# Patient Record
Sex: Female | Born: 2000 | Race: Black or African American | Hispanic: No | Marital: Single | Smoking: Never smoker
Health system: Southern US, Community
[De-identification: ages and names within clinical notes are randomized; demographics above are authoritative.]

## PROBLEM LIST (undated history)

## (undated) DIAGNOSIS — F410 Panic disorder [episodic paroxysmal anxiety] without agoraphobia: Secondary | ICD-10-CM

## (undated) DIAGNOSIS — E119 Type 2 diabetes mellitus without complications: Secondary | ICD-10-CM

---
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
## 2019-05-28 ENCOUNTER — Other Ambulatory Visit: Payer: Self-pay

## 2019-05-28 ENCOUNTER — Ambulatory Visit: Payer: BLUE CROSS/BLUE SHIELD | Attending: Internal Medicine

## 2019-05-28 DIAGNOSIS — Z20822 Contact with and (suspected) exposure to covid-19: Secondary | ICD-10-CM

## 2019-05-29 LAB — SARS-COV-2, NAA 2 DAY TAT

## 2019-05-29 LAB — NOVEL CORONAVIRUS, NAA: SARS-CoV-2, NAA: DETECTED — AB

## 2020-09-12 ENCOUNTER — Emergency Department (HOSPITAL_BASED_OUTPATIENT_CLINIC_OR_DEPARTMENT_OTHER): Payer: BLUE CROSS/BLUE SHIELD

## 2020-09-12 ENCOUNTER — Encounter (HOSPITAL_BASED_OUTPATIENT_CLINIC_OR_DEPARTMENT_OTHER): Payer: Self-pay

## 2020-09-12 ENCOUNTER — Emergency Department (HOSPITAL_BASED_OUTPATIENT_CLINIC_OR_DEPARTMENT_OTHER)
Admission: EM | Admit: 2020-09-12 | Discharge: 2020-09-12 | Disposition: A | Discharge status: Home / Self Care | Payer: BLUE CROSS/BLUE SHIELD | Attending: Emergency Medicine | Admitting: Emergency Medicine

## 2020-09-12 ENCOUNTER — Other Ambulatory Visit: Payer: Self-pay

## 2020-09-12 DIAGNOSIS — S0990XA Unspecified injury of head, initial encounter: Secondary | ICD-10-CM | POA: Diagnosis not present

## 2020-09-12 DIAGNOSIS — F17200 Nicotine dependence, unspecified, uncomplicated: Secondary | ICD-10-CM | POA: Insufficient documentation

## 2020-09-12 DIAGNOSIS — W19XXXA Unspecified fall, initial encounter: Secondary | ICD-10-CM

## 2020-09-12 DIAGNOSIS — E119 Type 2 diabetes mellitus without complications: Secondary | ICD-10-CM | POA: Diagnosis not present

## 2020-09-12 DIAGNOSIS — W182XXA Fall in (into) shower or empty bathtub, initial encounter: Secondary | ICD-10-CM | POA: Diagnosis not present

## 2020-09-12 MED ORDER — ACETAMINOPHEN 325 MG PO TABS
650.0000 mg | ORAL_TABLET | Freq: Once | ORAL | Status: AC
  Administered 2020-09-12: 650 mg via ORAL
  Filled 2020-09-12: qty 2

## 2020-09-12 MED ORDER — ONDANSETRON 4 MG PO TBDP
4.0000 mg | ORAL_TABLET | Freq: Once | ORAL | Status: AC
  Administered 2020-09-12: 4 mg via ORAL
  Filled 2020-09-12: qty 1

## 2020-09-12 NOTE — ED Provider Notes (Signed)
[facility] EMERGENCY DEPARTMENT Provider Note   CSN: [id number] Arrival date & time: 09/12/20  1816     History Chief Complaint  Patient presents with   Head Injury    Mindy Powers is a 20 y.o. female.  20 year old female with a past medical history of diabetes, panic attack presents to the ED status post mechanical fall while taking a shower around 3 AM this morning.  Patient reports she felt like she slipped, causing her to go face forward onto the wall of the bathtub.  There is pain to the frontal aspect, with a large goose egg noted.  States that she has had since the incident dizziness, nausea, feels that her eyes are somewhat heavy.  She has not taken any medication for improvement in her symptoms.  She does not have any prior history of aneurysms.  He does endorse some photophobia, phonophobia.  No vomiting, no loss of vision, no neck pain.  The history is provided by the patient and medical records.  Head Injury Location:  Frontal Time since incident:  16 hours Mechanism of injury: fall   Fall:    Fall occurred:  Standing   Impact surface:  Primary school teacher of impact:  Head   Entrapped after fall: no   Pain details:    Quality:  Aching   Radiates to:  Face   Severity:  Moderate   Duration:  16 hours   Timing:  Constant   Progression:  Unchanged Chronicity:  New Ineffective treatments:  None tried Associated symptoms: headache and nausea   Associated symptoms: no double vision, no loss of consciousness, no neck pain, no numbness and no vomiting       Past Medical History:  Diagnosis Date   Diabetes mellitus without complication (HCC)    prediabetes   Panic attack     There are no problems to display for this patient.   History reviewed. No pertinent surgical history.   OB History     Gravida  0   Para  0   Term  0   Preterm  0   AB  0   Living  0      SAB  0   IAB  0   Ectopic  0   Multiple  0   Live Births  0            No family history on file.  Social History   Tobacco Use   Smoking status: Never   Smokeless tobacco: Never   Tobacco comments:    pt reports she vapes  Vaping Use   Vaping Use: Every day  Substance Use Topics   Alcohol use: Yes   Drug use: Never    Home Medications Prior to Admission medications   Not on File    Allergies    Patient has no known allergies.  Review of Systems   Review of Systems  Constitutional:  Negative for chills and fever.  HENT:  Negative for sore throat.   Eyes:  Negative for double vision.  Respiratory:  Negative for shortness of breath.   Cardiovascular:  Negative for chest pain.  Gastrointestinal:  Positive for nausea. Negative for abdominal pain, constipation and vomiting.  Musculoskeletal:  Negative for neck pain.  Neurological:  Positive for headaches. Negative for loss of consciousness and numbness.  All other systems reviewed and are negative.  Physical Exam Updated Vital Signs BP (!) 136/94 (BP Location: Left Arm)   Pulse Marland Kitchen)  110   Temp 98.4 F (36.9 C) (Oral)   Resp 16   Ht 5\' 8"  (1.727 m)   Wt 93.4 kg   LMP 09/07/2020   SpO2 98%   BMI 31.32 kg/m   Physical Exam Vitals and nursing note reviewed.  Constitutional:      Appearance: Normal appearance.  HENT:     Head: Normocephalic.     Comments: Large goose egg to the forehead Eyes:     Pupils: Pupils are equal, round, and reactive to light.     Comments: Pupils are equal and reactive.   Neck:     Comments: No midline tenderness. Full ROM.  Cardiovascular:     Rate and Rhythm: Normal rate.  Pulmonary:     Effort: Pulmonary effort is normal.  Abdominal:     General: Abdomen is flat.  Musculoskeletal:     Cervical back: Normal range of motion and neck supple.  Skin:    General: Skin is warm and dry.  Neurological:     Mental Status: She is alert and oriented to person, place, and time.     Comments: Alert, oriented, thought content appropriate. Speech fluent  without evidence of aphasia. Able to follow 2 step commands without difficulty.  Cranial Nerves:  II:  Peripheral visual fields grossly normal, pupils, round, reactive to light III,IV, VI: ptosis not present, extra-ocular motions intact bilaterally  V,VII: smile symmetric, facial light touch sensation equal VIII: hearing grossly normal bilaterally  IX,X: midline uvula rise  XI: bilateral shoulder shrug equal and strong XII: midline tongue extension  Motor:  5/5 in upper and lower extremities bilaterally including strong and equal grip strength and dorsiflexion/plantar flexion Sensory: light touch normal in all extremities.  Cerebellar: normal finger-to-nose with bilateral upper extremities, pronator drift negative Gait: normal gait and balance      ED Results / Procedures / Treatments   Labs (all labs ordered are listed, but only abnormal results are displayed) Labs Reviewed - No data to display  EKG None  Radiology CT HEAD WO CONTRAST (11/07/2020)  Result Date: 09/12/2020 CLINICAL DATA:  Fall yesterday with facial pain, initial encounter EXAM: CT HEAD WITHOUT CONTRAST TECHNIQUE: Contiguous axial images were obtained from the base of the skull through the vertex without intravenous contrast. COMPARISON:  None. FINDINGS: Brain: No evidence of acute infarction, hemorrhage, hydrocephalus, extra-axial collection or mass lesion/mass effect. Vascular: No hyperdense vessel or unexpected calcification. Skull: Normal. Negative for fracture or focal lesion. Sinuses/Orbits: No acute finding. Other: None. IMPRESSION: No acute intracranial abnormality noted. Electronically Signed   By: 09/14/2020 M.D.   On: 09/12/2020 19:05    Procedures Procedures   Medications Ordered in ED Medications  acetaminophen (TYLENOL) tablet 650 mg (650 mg Oral Given 09/12/20 1848)  ondansetron (ZOFRAN-ODT) disintegrating tablet 4 mg (4 mg Oral Given 09/12/20 1848)    ED Course  I have reviewed the triage vital signs  and the nursing notes.  Pertinent labs & imaging results that were available during my care of the patient were reviewed by me and considered in my medical decision making (see chart for details).    MDM Rules/Calculators/A&P   Patient presents to the ED status post mechanical fall approximately 16 hours prior to arrival.  Endorses a headache that began after the fall, this is mostly around the front aspect of her head with radiation to her eyes, which she feels that these are heavy.  Describes dizziness with standing along with ambulating.  She is also  had some nausea but has not had any vomiting episodes.  She has not taken any medication for improvement in her headache.  He does not have any other prior history of head trauma, no prior history of aneurysms.  During evaluation for his overall well-appearing, vitals remarkable for slight increase in her heart rate with a heart rate of 110, she is appropriate.  She is ambulatory in the ED with a steady gait, neurologically intact without any decree sensation or strength.  Moves all upper and lower extremities.  We discussed CT head imaging to rule out any intracranial pathology.  I have a stronger suspicion for concussion at this time.  Provided with Tylenol, Zofran for symptomatic treatment.  CT on today's visit did not show any acute findings.  Patient was provided with Tylenol along with Zofran to help with her symptoms while in the ED.  She is overall neurologically intact, with a negative CT.  Suspicion for concussion at this time.  We went over return precautions, along with follow-up with PCP.  Patient agreeable of this, patient stable for discharge.   Portions of this note were generated with Scientist, clinical (histocompatibility and immunogenetics). Dictation errors may occur despite best attempts at proofreading.  Final Clinical Impression(s) / ED Diagnoses Final diagnoses:  Fall, initial encounter  Injury of head, initial encounter    Rx / DC Orders ED Discharge  Orders     None        Claude Manges, PA-C 09/12/20 1941    Terrilee Files, MD 09/13/20 1037

## 2020-09-12 NOTE — ED Notes (Signed)
Patient transported to CT 

## 2020-09-12 NOTE — Discharge Instructions (Addendum)
Your CT on today's visit did not show any acute findings.  A concussion is a very mild traumatic brain injury caused by a bump, jolt or blow to the head, most people recover quickly and fully. You can experience a wide variety of symptoms including:   - Confusion      - Difficulty concentrating       - Trouble remembering new info  - Headache      - Dizziness        - Fuzzy or blurry vision  - Fatigue      - Balance problems      - Light sensitivity  - Mood swings     - Changes in sleep or difficulty sleeping   To help these symptoms improve make sure you are getting plenty of rest, avoid screen time, loud music and strenuous mental activities. Avoid any strenuous physical activities, once your symptoms have resolved a slow and gradual return to activity is recommended. It is very important that you avoid situations in which you might sustain a second head injury as this can be very dangerous and life threatening. You cannot be medically cleared to return to normal activities until you have followed up with your primary doctor or a concussion specialist for reevaluation.

## 2020-09-12 NOTE — ED Notes (Addendum)
Patient fell and hit her forehead yesterday, denies loc.  Now having headacheand nausea, dizziness earlier, now resolved.

## 2020-09-12 NOTE — ED Triage Notes (Signed)
Pt states she fell yesterday and hit her head. Pt denies LOC. Today pt having HA, dizziness, and nausea. Pt A/Ox4 during triage.

## 2020-09-24 ENCOUNTER — Other Ambulatory Visit: Payer: Self-pay

## 2020-09-24 ENCOUNTER — Encounter (HOSPITAL_BASED_OUTPATIENT_CLINIC_OR_DEPARTMENT_OTHER): Payer: Self-pay | Admitting: *Deleted

## 2020-09-24 ENCOUNTER — Emergency Department (HOSPITAL_BASED_OUTPATIENT_CLINIC_OR_DEPARTMENT_OTHER)
Admission: EM | Admit: 2020-09-24 | Discharge: 2020-09-24 | Disposition: A | Discharge status: Home / Self Care | Payer: BLUE CROSS/BLUE SHIELD | Attending: Emergency Medicine | Admitting: Emergency Medicine

## 2020-09-24 DIAGNOSIS — Z23 Encounter for immunization: Secondary | ICD-10-CM | POA: Insufficient documentation

## 2020-09-24 DIAGNOSIS — S61411A Laceration without foreign body of right hand, initial encounter: Secondary | ICD-10-CM | POA: Insufficient documentation

## 2020-09-24 DIAGNOSIS — W260XXA Contact with knife, initial encounter: Secondary | ICD-10-CM | POA: Diagnosis not present

## 2020-09-24 DIAGNOSIS — S6991XA Unspecified injury of right wrist, hand and finger(s), initial encounter: Secondary | ICD-10-CM | POA: Diagnosis present

## 2020-09-24 MED ORDER — TETANUS-DIPHTH-ACELL PERTUSSIS 5-2.5-18.5 LF-MCG/0.5 IM SUSY
0.5000 mL | PREFILLED_SYRINGE | Freq: Once | INTRAMUSCULAR | Status: AC
  Administered 2020-09-24: 0.5 mL via INTRAMUSCULAR
  Filled 2020-09-24: qty 0.5

## 2020-09-24 NOTE — ED Triage Notes (Signed)
C/o lac to palm of left hand while at work by [employer] x 4 hrs ago

## 2020-09-24 NOTE — ED Notes (Signed)
Pt left hand placed in basin with saline mix

## 2020-09-24 NOTE — ED Provider Notes (Signed)
[facility] EMERGENCY DEPARTMENT Provider Note   CSN: [id number] Arrival date & time: 09/24/20  0037     History Chief Complaint  Patient presents with   Extremity Laceration    Mindy Powers is a 20 y.o. female.  The history is provided by the patient.  Laceration Location:  Hand Hand laceration location:  R palm Length:  2 cm Depth:  Cutaneous Quality: straight   Bleeding: controlled   Time since incident:  4 hours Laceration mechanism:  Knife Pain details:    Quality:  Aching   Severity:  Moderate   Timing:  Constant   Progression:  Unchanged Foreign body present:  No foreign bodies Relieved by:  Nothing Worsened by:  Nothing Ineffective treatments:  None tried Tetanus status:  Unknown Associated symptoms: no fever, no numbness, no rash, no redness, no swelling and no streaking       Past Medical History:  Diagnosis Date   Diabetes mellitus without complication (HCC)    prediabetes   Panic attack     There are no problems to display for this patient.   History reviewed. No pertinent surgical history.   OB History     Gravida  0   Para  0   Term  0   Preterm  0   AB  0   Living  0      SAB  0   IAB  0   Ectopic  0   Multiple  0   Live Births  0           History reviewed. No pertinent family history.  Social History   Tobacco Use   Smoking status: Never   Smokeless tobacco: Never   Tobacco comments:    pt reports she vapes  Vaping Use   Vaping Use: Every day  Substance Use Topics   Alcohol use: Yes   Drug use: Never    Home Medications Prior to Admission medications   Not on File    Allergies    Patient has no known allergies.  Review of Systems   Review of Systems  Constitutional:  Negative for fever.  HENT:  Negative for facial swelling.   Eyes:  Negative for redness.  Respiratory:  Negative for wheezing.   Cardiovascular:  Negative for leg swelling.  Gastrointestinal:  Negative for  vomiting.  Genitourinary:  Negative for difficulty urinating.  Musculoskeletal:  Negative for neck stiffness.  Skin:  Positive for wound. Negative for rash.  Neurological:  Negative for facial asymmetry.  Psychiatric/Behavioral:  Negative for agitation.    Physical Exam Updated Vital Signs BP 137/81 (BP Location: Right Arm)   Pulse (!) 107   Temp 98.6 F (37 C) (Oral)   Resp 18   Ht 5\' 8"  (1.727 m)   Wt 93 kg   LMP 09/17/2020   SpO2 100%   BMI 31.17 kg/m   Physical Exam Vitals and nursing note reviewed.  Constitutional:      General: She is not in acute distress.    Appearance: Normal appearance.  HENT:     Head: Normocephalic and atraumatic.     Nose: Nose normal.  Eyes:     Extraocular Movements: Extraocular movements intact.     Conjunctiva/sclera: Conjunctivae normal.  Cardiovascular:     Rate and Rhythm: Normal rate and regular rhythm.     Pulses: Normal pulses.     Heart sounds: Normal heart sounds.  Pulmonary:     Effort: Pulmonary effort  is normal.     Breath sounds: Normal breath sounds.  Abdominal:     General: Abdomen is flat. Bowel sounds are normal.     Palpations: Abdomen is soft.     Tenderness: There is no abdominal tenderness. There is no guarding.  Musculoskeletal:        General: Normal range of motion.       Arms:     Cervical back: Normal range of motion and neck supple.  Skin:    General: Skin is warm and dry.     Capillary Refill: Capillary refill takes less than 2 seconds.  Neurological:     General: No focal deficit present.     Mental Status: She is alert and oriented to person, place, and time.     Deep Tendon Reflexes: Reflexes normal.  Psychiatric:        Mood and Affect: Mood normal.        Behavior: Behavior normal.    ED Results / Procedures / Treatments   Labs (all labs ordered are listed, but only abnormal results are displayed) Labs Reviewed - No data to display  EKG None  Radiology No results  found.  Procedures .Marland KitchenLaceration Repair  Date/Time: 09/24/2020 1:17 AM Performed by: Cy Blamer, MD Authorized by: Cy Blamer, MD   Consent:    Consent obtained:  Verbal   Consent given by:  Patient   Risks discussed:  Infection, need for additional repair, nerve damage, pain, poor cosmetic result, poor wound healing, vascular damage, tendon damage and retained foreign body   Alternatives discussed:  No treatment Universal protocol:    Patient identity confirmed:  Arm band Anesthesia:    Anesthesia method:  None Laceration details:    Location:  Hand   Hand location:  R palm   Length (cm):  1   Depth (mm):  0.3 Pre-procedure details:    Preparation:  Patient was prepped and draped in usual sterile fashion Exploration:    Wound extent: no areolar tissue violation noted, no foreign bodies/material noted, no muscle damage noted, no nerve damage noted, no tendon damage noted, no underlying fracture noted and no vascular damage noted     Contaminated: no   Treatment:    Area cleansed with:  Povidone-iodine and chlorhexidine   Amount of cleaning:  Extensive   Irrigation solution:  Sterile water   Debridement:  None   Undermining:  None   Scar revision: no   Skin repair:    Repair method:  Tissue adhesive Approximation:    Approximation:  Close Repair type:    Repair type:  Simple Post-procedure details:    Dressing:  Sterile dressing   Procedure completion:  Tolerated well, no immediate complications   Medications Ordered in ED Medications  Tdap (BOOSTRIX) injection 0.5 mL (0.5 mLs Intramuscular Given 09/24/20 0051)    ED Course  I have reviewed the triage vital signs and the nursing notes.  Pertinent labs & imaging results that were available during my care of the patient were reviewed by me and considered in my medical decision making (see chart for details).    No submerging the wound in a pool, sink, ocean, lake stream, tube or any body of water for 2 weeks.   Strict wound care instructions given.    Mindy Powers was evaluated in Emergency Department on 09/24/2020 for the symptoms described in the history of present illness. She was evaluated in the context of the global COVID-19 pandemic, which necessitated consideration that  the patient might be at risk for infection with the SARS-CoV-2 virus that causes COVID-19. Institutional protocols and algorithms that pertain to the evaluation of patients at risk for COVID-19 are in a state of rapid change based on information released by regulatory bodies including the CDC and federal and state organizations. These policies and algorithms were followed during the patient's care in the ED.  Final Clinical Impression(s) / ED Diagnoses Final diagnoses:  None   Return for intractable cough, coughing up blood, fevers > 100.4 unrelieved by medication, shortness of breath, intractable vomiting, chest pain, shortness of breath, weakness, numbness, changes in speech, facial asymmetry, abdominal pain, passing out, Inability to tolerate liquids or food, cough, altered mental status or any concerns. No signs of systemic illness or infection. The patient is nontoxic-appearing on exam and vital signs are within normal limits. I have reviewed the triage vital signs and the nursing notes. Pertinent labs & imaging results that were available during my care of the patient were reviewed by me and considered in my medical decision making (see chart for details). After history, exam, and medical workup I feel the patient has been appropriately medically screened and is safe for discharge home. Pertinent diagnoses were discussed with the patient. Patient was given return precautions. Rx / DC Orders ED Discharge Orders     None        Latavion Halls, MD 09/24/20 0263

## 2021-11-06 ENCOUNTER — Encounter (HOSPITAL_BASED_OUTPATIENT_CLINIC_OR_DEPARTMENT_OTHER): Payer: Self-pay | Admitting: Emergency Medicine

## 2021-11-06 ENCOUNTER — Other Ambulatory Visit: Payer: Self-pay

## 2021-11-06 ENCOUNTER — Emergency Department (HOSPITAL_BASED_OUTPATIENT_CLINIC_OR_DEPARTMENT_OTHER)
Admission: EM | Admit: 2021-11-06 | Discharge: 2021-11-07 | Disposition: A | Discharge status: Home / Self Care | Payer: BC Managed Care – PPO | Attending: Emergency Medicine | Admitting: Emergency Medicine

## 2021-11-06 DIAGNOSIS — F419 Anxiety disorder, unspecified: Secondary | ICD-10-CM | POA: Diagnosis not present

## 2021-11-06 DIAGNOSIS — F410 Panic disorder [episodic paroxysmal anxiety] without agoraphobia: Secondary | ICD-10-CM | POA: Diagnosis not present

## 2021-11-06 NOTE — ED Triage Notes (Signed)
Patient arrived via POV c/o panic attacks occurring today. Patient states having 3 panic attacks. Patient recently started on buspar and zoloft. Patient states taking 1 buspar and 1 zoloft today. Patient is AO x 4, VS w/ tachycardia, normal gait.

## 2021-11-07 NOTE — Discharge Instructions (Addendum)
What is 4-7-8 breathing? The 4-7-8 breathing technique is a style of intentional breathwork that can calm your mind and body. Though popularized in 2015 by integrative medicine specialist Stark Klein, MD, it has ancient roots in the yogic practice of pranayama, or focusing on the breath.   it goes like this: Inhale through your nose for four counts. Hold your breath for seven counts. Exhale through your mouth for eight counts. But there's a little bit more to it if you want to ensure that you're tapping into all of its health benefits.

## 2021-11-07 NOTE — ED Provider Notes (Signed)
[facility] EMERGENCY DEPARTMENT Provider Note   CSN: [id number] Arrival date & time: 11/06/21  2346     History  Chief Complaint  Patient presents with   Panic Attack    Mindy Powers is a 21 y.o. female.  The history is provided by the patient and a parent.  Illness Location:  At home Quality:  Panic attack Severity:  Moderate Onset quality:  Sudden Timing:  Constant Progression:  Resolved Chronicity:  Recurrent Context:  Has anxiety disorder and panic and started 3 weeks ago on Buspar a 1 week on Zoloft.  has not seen a therapist yet. Relieved by:  Nothing Worsened by:  Unknown Ineffective treatments:  Zoloft and Buspar which are not yet physiologically active. Associated symptoms: no chest pain, no fever, no headaches, no rhinorrhea, no vomiting and no wheezing   Was having an anxiety attack three times tonight.  No SI or HI no AH or VH.       Home Medications Prior to Admission medications   Not on File      Allergies    Patient has no known allergies.    Review of Systems   Review of Systems  Constitutional:  Negative for fever.  HENT:  Negative for rhinorrhea.   Respiratory:  Negative for wheezing.   Cardiovascular:  Negative for chest pain.  Gastrointestinal:  Negative for vomiting.  Neurological:  Negative for headaches.  Psychiatric/Behavioral:  Negative for self-injury and suicidal ideas. The patient is nervous/anxious.   All other systems reviewed and are negative.   Physical Exam Updated Vital Signs BP 128/85 (BP Location: Right Arm)   Pulse (!) 114   Temp 97.7 F (36.5 C) (Oral)   Resp 20   Ht 5\' 8"  (1.727 m)   Wt 86.2 kg   LMP 10/22/2021 (Approximate)   SpO2 100%   BMI 28.89 kg/m  Physical Exam Vitals and nursing note reviewed.  Constitutional:      General: She is not in acute distress.    Appearance: Normal appearance. She is well-developed.  HENT:     Head: Normocephalic and atraumatic.     Nose: Nose normal.   Eyes:     Pupils: Pupils are equal, round, and reactive to light.  Cardiovascular:     Rate and Rhythm: Normal rate and regular rhythm.     Pulses: Normal pulses.     Heart sounds: Normal heart sounds.  Pulmonary:     Effort: Pulmonary effort is normal. No respiratory distress.     Breath sounds: Normal breath sounds.  Abdominal:     General: Bowel sounds are normal. There is no distension.     Palpations: Abdomen is soft.     Tenderness: There is no abdominal tenderness. There is no guarding or rebound.  Genitourinary:    Vagina: No vaginal discharge.  Musculoskeletal:        General: Normal range of motion.     Cervical back: Neck supple.  Skin:    General: Skin is dry.     Capillary Refill: Capillary refill takes less than 2 seconds.     Findings: No erythema or rash.  Neurological:     General: No focal deficit present.     Mental Status: She is alert.     Deep Tendon Reflexes: Reflexes normal.  Psychiatric:        Mood and Affect: Mood is anxious.        Thought Content: Thought content does not include homicidal or suicidal  ideation.     ED Results / Procedures / Treatments   Labs (all labs ordered are listed, but only abnormal results are displayed) Labs Reviewed - No data to display  EKG None  Radiology No results found.  Procedures Procedures    Medications Ordered in ED Medications - No data to display  ED Course/ Medical Decision Making/ A&P                           Medical Decision Making Patient with 3 panic attacks tonight recently started to medications.  Has not yet seen therapist.    Amount and/or Complexity of Data Reviewed Independent Historian: parent    Details: See above  External Data Reviewed: notes.    Details: Previous notes reviewed   Risk Risk Details: I had a lengthy discussion with patient and family regarding symptoms and management.  Medications are not yet biologically active in the body. Patient is not SI or HI.  She  is not having hallucinations.  This is not agitated.  She had goal directed speech.  Patient will need therapy.  I have advised 478 breathing and keeping a journal of the events and any triggers.  I have given her the contact information for [facility]. Stable for discharge.  Strict return.      Final Clinical Impression(s) / ED Diagnoses Final diagnoses:  None  Return for intractable cough, coughing up blood, fevers > 100.4 unrelieved by medication, shortness of breath, intractable vomiting, chest pain, shortness of breath, weakness, numbness, changes in speech, facial asymmetry, abdominal pain, passing out, Inability to tolerate liquids or food, cough, altered mental status or any concerns. No signs of systemic illness or infection. The patient is nontoxic-appearing on exam and vital signs are within normal limits.  I have reviewed the triage vital signs and the nursing notes. Pertinent labs & imaging results that were available during my care of the patient were reviewed by me and considered in my medical decision making (see chart for details). After history, exam, and medical workup I feel the patient has been appropriately medically screened and is safe for discharge home. Pertinent diagnoses were discussed with the patient. Patient was given return precautions.   Rx / DC Orders ED Discharge Orders     None         Jeronda Don, MD 11/07/21 1497

## 2021-12-19 ENCOUNTER — Encounter (HOSPITAL_BASED_OUTPATIENT_CLINIC_OR_DEPARTMENT_OTHER): Payer: Self-pay | Admitting: Emergency Medicine

## 2021-12-19 ENCOUNTER — Emergency Department (HOSPITAL_COMMUNITY): Payer: BC Managed Care – PPO

## 2021-12-19 ENCOUNTER — Other Ambulatory Visit: Payer: Self-pay

## 2021-12-19 ENCOUNTER — Emergency Department (HOSPITAL_BASED_OUTPATIENT_CLINIC_OR_DEPARTMENT_OTHER): Payer: BC Managed Care – PPO

## 2021-12-19 ENCOUNTER — Emergency Department (HOSPITAL_BASED_OUTPATIENT_CLINIC_OR_DEPARTMENT_OTHER)
Admission: EM | Admit: 2021-12-19 | Discharge: 2021-12-19 | Disposition: A | Discharge status: Home / Self Care | Payer: BC Managed Care – PPO | Attending: Emergency Medicine | Admitting: Emergency Medicine

## 2021-12-19 DIAGNOSIS — H538 Other visual disturbances: Secondary | ICD-10-CM | POA: Diagnosis present

## 2021-12-19 DIAGNOSIS — R002 Palpitations: Secondary | ICD-10-CM | POA: Diagnosis not present

## 2021-12-19 DIAGNOSIS — R42 Dizziness and giddiness: Secondary | ICD-10-CM | POA: Diagnosis not present

## 2021-12-19 DIAGNOSIS — R930 Abnormal findings on diagnostic imaging of skull and head, not elsewhere classified: Secondary | ICD-10-CM | POA: Insufficient documentation

## 2021-12-19 DIAGNOSIS — R519 Headache, unspecified: Secondary | ICD-10-CM

## 2021-12-19 DIAGNOSIS — R0600 Dyspnea, unspecified: Secondary | ICD-10-CM | POA: Insufficient documentation

## 2021-12-19 LAB — CBC WITH DIFFERENTIAL/PLATELET
Abs Immature Granulocytes: 0.01 10*3/uL (ref 0.00–0.07)
Basophils Absolute: 0 10*3/uL (ref 0.0–0.1)
Basophils Relative: 1 %
Eosinophils Absolute: 0.1 10*3/uL (ref 0.0–0.5)
Eosinophils Relative: 2 %
HCT: 37.7 % (ref 36.0–46.0)
Hemoglobin: 12.6 g/dL (ref 12.0–15.0)
Immature Granulocytes: 0 %
Lymphocytes Relative: 45 %
Lymphs Abs: 2.4 10*3/uL (ref 0.7–4.0)
MCH: 30.1 pg (ref 26.0–34.0)
MCHC: 33.4 g/dL (ref 30.0–36.0)
MCV: 90.2 fL (ref 80.0–100.0)
Monocytes Absolute: 0.3 10*3/uL (ref 0.1–1.0)
Monocytes Relative: 6 %
Neutro Abs: 2.4 10*3/uL (ref 1.7–7.7)
Neutrophils Relative %: 46 %
Platelets: 228 10*3/uL (ref 150–400)
RBC: 4.18 MIL/uL (ref 3.87–5.11)
RDW: 11.8 % (ref 11.5–15.5)
WBC: 5.3 10*3/uL (ref 4.0–10.5)
nRBC: 0 % (ref 0.0–0.2)

## 2021-12-19 LAB — CBG MONITORING, ED: Glucose-Capillary: 90 mg/dL (ref 70–99)

## 2021-12-19 LAB — COMPREHENSIVE METABOLIC PANEL
ALT: 40 U/L (ref 0–44)
AST: 24 U/L (ref 15–41)
Albumin: 3.7 g/dL (ref 3.5–5.0)
Alkaline Phosphatase: 51 U/L (ref 38–126)
Anion gap: 6 (ref 5–15)
BUN: 12 mg/dL (ref 6–20)
CO2: 24 mmol/L (ref 22–32)
Calcium: 8.7 mg/dL — ABNORMAL LOW (ref 8.9–10.3)
Chloride: 107 mmol/L (ref 98–111)
Creatinine, Ser: 0.85 mg/dL (ref 0.44–1.00)
GFR, Estimated: >60 mL/min (ref >60)
Glucose, Bld: 100 mg/dL — ABNORMAL HIGH (ref 70–99)
Potassium: 4 mmol/L (ref 3.5–5.1)
Sodium: 137 mmol/L (ref 135–145)
Total Bilirubin: 0.4 mg/dL (ref 0.3–1.2)
Total Protein: 7.2 g/dL (ref 6.5–8.1)

## 2021-12-19 LAB — PREGNANCY, URINE: Preg Test, Ur: NEGATIVE

## 2021-12-19 LAB — TROPONIN I (HIGH SENSITIVITY): Troponin I (High Sensitivity): <2 ng/L (ref <18)

## 2021-12-19 MED ORDER — LORAZEPAM 2 MG/ML IJ SOLN
0.5000 mg | Freq: Once | INTRAMUSCULAR | Status: AC | PRN
  Administered 2021-12-19: 0.5 mg via INTRAVENOUS
  Filled 2021-12-19: qty 1

## 2021-12-19 MED ORDER — GADOBUTROL 1 MMOL/ML IV SOLN
7.5000 mL | Freq: Once | INTRAVENOUS | Status: AC | PRN
  Administered 2021-12-19: 7.5 mL via INTRAVENOUS

## 2021-12-19 NOTE — ED Notes (Signed)
[facility] at bedside 

## 2021-12-19 NOTE — ED Notes (Signed)
Pt endorses hx of migraines, feels similar but the worst one she's had before. 3x weeks. Ibuprofen no improvement. 8/10 every morning, tapers off to 6/10 throughout the day, every day the same pattern. No v/d, recent trauma, nasal drainage, recent illness or fever. Minor dizziness and nausea, does not need meds for nausea.

## 2021-12-19 NOTE — ED Provider Notes (Signed)
1:37 PM Patient transferred from [facility] for MRI and MRV. Has had on and off headache/blurry vision (bilaterally) for a few weeks. Is currently asymptomatic.   3:17 PM Patient is in MRI.  Of note, patient states she has been going to an eye doctor multiple times for this blurry vision.  My suspicion for intracranial hypertension is quite low, especially given nonconsistent and intermittent symptoms.  Otherwise, MRI is pending. Care transferred to Dr. Rhunette Croft.   Pricilla Loveless, MD 12/19/21 1517

## 2021-12-19 NOTE — Discharge Instructions (Addendum)
The MRI of your brain is reassuring.  No evidence of conditions like multiple sclerosis, stroke, brain bleed, brain mass.  Please discuss your symptoms with the eye doctor along with the normal MRI, and see if they want you to follow-up with a neurologist.  Neurology information has been provided, call them to set up an appointment.

## 2021-12-19 NOTE — ED Provider Notes (Signed)
[facility] EMERGENCY DEPARTMENT Provider Note   CSN: [id number] Arrival date & time: 12/19/21  [id number]     History  Chief Complaint  Patient presents with   Headache   Blurred Vision    Mindy Powers is a 21 y.o. female.  HPI     Headaches, present for a few months, getting worse.  Feels like a lot of pressure, forehead area is worse.  Weight on eyes, can't open them up at times. Has been to the eye doctors about the blurred vision, can't find something wrong with the eyes, have changed contacts and it is not helping.  Blurred vision for about 2 weeks, constant, both eyes. Sometimes double vision, sometimes like looking through water, more floaters than usual bilaterally.  Not missing pieces of vision/visual field.   Was seeing [facility], went 3 times.  Had just tried to change contact brands (reports vision tested was not changed)  but did not examine her really.    Left arm weak a few days ago but not since then. Lasted about 30 minutes.  Just weakness.  No numbness, no difficulty walking or talking, no facial droop. No head trauma, fever, chills.  Nausea, no vomiting. Nothing makes headache better or worse, not bright lights or loud sounds, taking advil but that doesn't help. Headache now 6-/10  Had dyspnea this Am, when talking to mom, lasted 15 minutes, felt lightheaded, is the only time it happened. No cp, no cough. Did have palpitations.  No abd pain, diarrhea.  Not feeling like under more stress, no known fam hx of MS or early heart diseae in immediate fam (great grandma)   Past Medical History:  Diagnosis Date   Diabetes mellitus without complication ([place])    prediabetes   Panic attack       Home Medications Prior to Admission medications   Not on File      Allergies    Patient has no known allergies.    Review of Systems   Review of Systems  Physical Exam Updated Vital Signs BP 112/66 (BP Location: Right Arm)   Pulse 93   Temp  98.3 F (36.8 C) (Oral)   Resp 17   SpO2 100%  Physical Exam Vitals and nursing note reviewed.  Constitutional:      General: She is not in acute distress.    Appearance: Normal appearance. She is well-developed. She is not ill-appearing or diaphoretic.  HENT:     Head: Normocephalic and atraumatic.  Eyes:     General: No visual field deficit.    Extraocular Movements: Extraocular movements intact.     Conjunctiva/sclera: Conjunctivae normal.     Pupils: Pupils are equal, round, and reactive to light.  Cardiovascular:     Rate and Rhythm: Normal rate and regular rhythm.     Pulses: Normal pulses.     Heart sounds: Normal heart sounds. No murmur heard.    No friction rub. No gallop.  Pulmonary:     Effort: Pulmonary effort is normal. No respiratory distress.     Breath sounds: Normal breath sounds. No wheezing or rales.  Abdominal:     General: There is no distension.     Palpations: Abdomen is soft.     Tenderness: There is no abdominal tenderness. There is no guarding.  Musculoskeletal:        General: No swelling or tenderness.     Cervical back: Normal range of motion.  Skin:    General: Skin  is warm and dry.     Findings: No erythema or rash.  Neurological:     General: No focal deficit present.     Mental Status: She is alert and oriented to person, place, and time.     GCS: GCS eye subscore is 4. GCS verbal subscore is 5. GCS motor subscore is 6.     Cranial Nerves: No cranial nerve deficit, dysarthria or facial asymmetry.     Sensory: No sensory deficit.     Motor: No weakness or tremor.     Coordination: Coordination normal. Finger-Nose-Finger Test normal.     Gait: Gait normal.     ED Results / Procedures / Treatments   Labs (all labs ordered are listed, but only abnormal results are displayed) Labs Reviewed  COMPREHENSIVE METABOLIC PANEL - Abnormal; Notable for the following components:      Result Value   Glucose, Bld 100 (*)    Calcium 8.7 (*)    All  other components within normal limits  CBC WITH DIFFERENTIAL/PLATELET  PREGNANCY, URINE  CBG MONITORING, ED  TROPONIN I (HIGH SENSITIVITY)    EKG EKG Interpretation  Date/Time:  Sunday December 19 2021 09:55:54 EST Ventricular Rate:  84 PR Interval:  176 QRS Duration: 95 QT Interval:  356 QTC Calculation: 421 R Axis:   55 Text Interpretation: Sinus rhythm No significant change since last tracing Confirmed by Alvira Monday ([id number]) on 12/19/2021 9:11:28 PM  Radiology MR Brain W and Wo Contrast  Result Date: 12/19/2021 CLINICAL DATA:  Headache. Intracranial hypertension features. Blurry vision for a few weeks. Concern for CNS neoplasm. EXAM: MRI HEAD WITHOUT AND WITH CONTRAST MRV HEAD WITHOUT CONTRAST TECHNIQUE: Multiplanar, multiecho pulse sequences of the brain and surrounding structures were obtained without and with intravenous contrast. Angiographic images of the intracranial venous structures were obtained using MRV technique without intravenous contrast. CONTRAST:  7.43mL GADAVIST GADOBUTROL 1 MMOL/ML IV SOLN COMPARISON:  Same-day CT brain FINDINGS: MRI HEAD: Brain: No acute infarction, hemorrhage, hydrocephalus, extra-axial collection or mass lesion. Vascular: Normal flow voids. Skull and upper cervical spine: Normal marrow signal. Sinuses/Orbits: No acute or significant finding. Other: None. MR VENOGRAM: There is no evidence of dural venous sinus or deep cerebral vein thrombosis. No dural venous sinus stenosis. IMPRESSION: 1. No acute intracranial abnormality. 2. No evidence of dural venous sinus thrombosis or stenosis. Electronically Signed   By: Lorenza Cambridge M.D.   On: 12/19/2021 17:09   MR MRV HEAD W WO CONTRAST  Result Date: 12/19/2021 CLINICAL DATA:  Headache. Intracranial hypertension features. Blurry vision for a few weeks. Concern for CNS neoplasm. EXAM: MRI HEAD WITHOUT AND WITH CONTRAST MRV HEAD WITHOUT CONTRAST TECHNIQUE: Multiplanar, multiecho pulse sequences of the  brain and surrounding structures were obtained without and with intravenous contrast. Angiographic images of the intracranial venous structures were obtained using MRV technique without intravenous contrast. CONTRAST:  7.64mL GADAVIST GADOBUTROL 1 MMOL/ML IV SOLN COMPARISON:  Same-day CT brain FINDINGS: MRI HEAD: Brain: No acute infarction, hemorrhage, hydrocephalus, extra-axial collection or mass lesion. Vascular: Normal flow voids. Skull and upper cervical spine: Normal marrow signal. Sinuses/Orbits: No acute or significant finding. Other: None. MR VENOGRAM: There is no evidence of dural venous sinus or deep cerebral vein thrombosis. No dural venous sinus stenosis. IMPRESSION: 1. No acute intracranial abnormality. 2. No evidence of dural venous sinus thrombosis or stenosis. Electronically Signed   By: Lorenza Cambridge M.D.   On: 12/19/2021 17:09   CT Head Wo Contrast  Result Date: 12/19/2021  CLINICAL DATA:  Blurred vision in both eyes for several days. EXAM: CT HEAD WITHOUT CONTRAST TECHNIQUE: Contiguous axial images were obtained from the base of the skull through the vertex without intravenous contrast. RADIATION DOSE REDUCTION: This exam was performed according to the departmental dose-optimization program which includes automated exposure control, adjustment of the mA and/or kV according to patient size and/or use of iterative reconstruction technique. COMPARISON:  September 12, 2020 FINDINGS: Brain: No evidence of acute infarction, hemorrhage, hydrocephalus, extra-axial collection or mass lesion/mass effect. Vascular: No hyperdense vessel is noted. Skull: Normal. Negative for fracture or focal lesion. Sinuses/Orbits: No acute finding. Other: None. IMPRESSION: No acute intracranial abnormality seen. Electronically Signed   By: Abelardo Diesel M.D.   On: 12/19/2021 10:59   DG Chest 2 View  Result Date: 12/19/2021 CLINICAL DATA:  Shortness of breath. EXAM: CHEST - 2 VIEW COMPARISON:  None Available. FINDINGS:  The heart size and mediastinal contours are within normal limits. Both lungs are clear. The visualized skeletal structures are unremarkable. IMPRESSION: No active cardiopulmonary disease. Electronically Signed   By: Abelardo Diesel M.D.   On: 12/19/2021 10:56    Procedures Procedures    Medications Ordered in ED Medications  LORazepam (ATIVAN) injection 0.5 mg (0.5 mg Intravenous Given 12/19/21 1148)  gadobutrol (GADAVIST) 1 MMOL/ML injection 7.5 mL (7.5 mLs Intravenous Contrast Given 12/19/21 1639)    ED Course/ Medical Decision Making/ A&P                            21yo female with history of pre-DM, anxiety presents with concern for headache, blurred vision, episode of left arm weakness days ago.   DDx includes ICH, CVA, MS, CVT, idiopathic intracranial hypertension, electrolyte abnormalities, complicated migraine, other.   CT head without acute abnormalities.  Labs completed and personally evaluated by me without acute abnormalities.   Will transfer to [facility] to evaluate for MS, MRV for CVT.      Also noted episode of dyspnea--CXR evaluated by me without pneumonia, pneumothorax, pulmonary edema.  EKG with normal sinus rhythm.  Troponin negative, no sign of ACS. PERC negative and low risk [place].  Do not suspect other emergent etiology of dyspnea.  WIll transfer for further evaluation, however of headache, blurred vision.           Final Clinical Impression(s) / ED Diagnoses Final diagnoses:  Blurred vision, bilateral  Bad headache    Rx / DC Orders ED Discharge Orders          Ordered    Ambulatory referral to Neurology       Comments: An appointment is requested in approximately: 2 weeks   12/19/21 1802              Gareth Morgan, MD 12/19/21 2153

## 2021-12-19 NOTE — ED Notes (Signed)
Drew and held red/blue top as well.

## 2021-12-19 NOTE — ED Provider Notes (Signed)
  Physical Exam  BP 95/63   Pulse 80   Temp 98.2 F (36.8 C)   Resp 17   SpO2 100%   Physical Exam  Procedures  Procedures  ED Course / MDM    Medical Decision Making Amount and/or Complexity of Data Reviewed Labs: ordered. Radiology: ordered.  Risk Prescription drug management.   Patient assessed after her MRI.  She was sent to the emergency room for MRI given that she was having headaches, vision change.  MRI brain with and without contrast is negative for any acute findings.  Results discussed with the patient.  Informed her, that she can discuss this finding with her eye specialist, and call the neurologist at the number provided for outpatient follow-up.  Patient understands.  Stable for discharge.       Derwood Kaplan, MD 12/19/21 [phone]

## 2021-12-19 NOTE — ED Triage Notes (Signed)
Pt reports headache and blurred vision in both eyes for the past few days that got worse this am. Also complains of neck/shoulder pain and shortness of breath. Denies cough, sore throat or runny nose.

## 2021-12-19 NOTE — ED Notes (Signed)
Patient transported to MRI 

## 2021-12-19 NOTE — ED Notes (Signed)
Patient arrived from [facility] awaiting MRI as ordered. Patient alert and oriented, NAD

## 2021-12-19 NOTE — ED Notes (Signed)
Report called and given to Janan Halter Charge RN [facility] ED.

## 2021-12-21 ENCOUNTER — Encounter: Payer: Self-pay | Admitting: Neurology

## 2022-01-03 ENCOUNTER — Encounter: Payer: Self-pay | Admitting: Neurology

## 2022-02-10 NOTE — Progress Notes (Signed)
NEUROLOGY CONSULTATION NOTE  Mindy Powers MRN: [id number] DOB: [birth year]  Referring provider: Varney Biles, MD (ED referral) Primary care provider: Linward Natal, PA-C  Reason for consult:  migraine  Assessment/Plan:   Paresthesias of the forehead - unclear etiology.  MRI of brain negative.  Not associated with trigeminal neuralgia or headache syndrome such as migraine. Bilateral blurred vision.  Unclear etiology.  Eye exam by optometry reportedly unremarkable.    I unfortunately do not have a definite diagnosis.  MRI unremarkable.  I am going to refer her to ophthalmology for another opinion.  Subjective:  Mindy Powers is a 22 year old right-handed female who presents for migraines.  History supplemented by ED note.  About 10 months ago, she began experiencing tingling between her eyes and above both eyebrows.  Not too long afterwards, she started experiencing blurred vision in both eyes, "like looking through water".  Symptoms are persistent.  No associated headache, facial pain, nausea, photophobia, phonophobia, visual obscurations, pulsatile tinnitus.  One time she woke up with left arm numbness and heaviness that lasted 30 minutes but she thinks she may have just slept on it.  Otherwise, no unilateral numbness or weakness.  She saw optometry in August who evaluated her and prescribed her contact lenses which were not helpful.  They did not dilate her eyes.  She was seen in the ED on 12/19/2021.  CT head and MRI/MRV of brain with and without contrast personally reviewed revealed no mass lesion, dural sinus thrombosis or other acute intracranial abnormality.  According to the ED note, she was exhibiting headache.  However, she states that she never had any associated headache.  She has had maybe two classic migraine headaches several years ago.   PAST MEDICAL HISTORY: Past Medical History:  Diagnosis Date   Diabetes mellitus without complication ([place])    prediabetes   Panic  attack     MEDICATIONS: Current Outpatient Medications on File Prior to Visit  Medication Sig Dispense Refill   levonorgestrel (KYLEENA) 19.5 MG IUD 1 each by Intrauterine route once.     No current facility-administered medications on file prior to visit.     ALLERGIES: No Known Allergies  FAMILY HISTORY: No family history on file.  Objective:  Blood pressure 116/80, pulse 86, height 5\' 8"  (1.727 m), weight 210 lb 6.4 oz (95.4 kg). General: No acute distress.  Patient appears well-groomed.   Head:  Normocephalic/atraumatic Eyes:  fundi examined but not visualized Neck: supple, no paraspinal tenderness, full range of motion Back: No paraspinal tenderness Heart: regular rate and rhythm Lungs: Clear to auscultation bilaterally. Vascular: No carotid bruits. Neurological Exam: Mental status: alert and oriented to person, place, and time, speech fluent and not dysarthric, language intact. Cranial nerves: CN I: not tested CN II: pupils equal, round and reactive to light, visual fields intact CN III, IV, VI:  full range of motion, no nystagmus, no ptosis CN V: facial sensation intact. CN VII: upper and lower face symmetric CN VIII: hearing intact CN IX, X: gag intact, uvula midline CN XI: sternocleidomastoid and trapezius muscles intact CN XII: tongue midline Bulk & Tone: normal, no fasciculations. Motor:  muscle strength 5/5 throughout Sensation:  Pinprick, temperature and vibratory sensation intact. Deep Tendon Reflexes:  2+ throughout,  toes downgoing.   Finger to nose testing:  Without dysmetria.   Heel to shin:  Without dysmetria.   Gait:  Normal station and stride.  Romberg negative.    Thank you for allowing me to take  part in the care of this patient.  Metta Clines, DO   CC:  Linward Natal, [place]

## 2022-02-11 ENCOUNTER — Ambulatory Visit: Payer: BC Managed Care – PPO | Admitting: Neurology

## 2022-02-14 ENCOUNTER — Ambulatory Visit (INDEPENDENT_AMBULATORY_CARE_PROVIDER_SITE_OTHER): Payer: BC Managed Care – PPO | Admitting: Neurology

## 2022-02-14 ENCOUNTER — Encounter: Payer: Self-pay | Admitting: Neurology

## 2022-02-14 VITALS — BP 116/80 | HR 86 | Ht 68.0 in | Wt 210.4 lb

## 2022-02-14 DIAGNOSIS — R200 Anesthesia of skin: Secondary | ICD-10-CM

## 2022-02-14 DIAGNOSIS — H538 Other visual disturbances: Secondary | ICD-10-CM

## 2022-02-14 NOTE — Patient Instructions (Signed)
I would like to refer you to ophthalmology for further evaluation of the blurred vision I do not have an explanation of the numbness/tingling on the forehead - it is not migraine, MS or other nerve problem.   Further recommendations pending ophthalmology findings.

## 2022-05-10 ENCOUNTER — Ambulatory Visit: Payer: BC Managed Care – PPO | Admitting: Neurology

## 2022-05-25 ENCOUNTER — Ambulatory Visit: Payer: BC Managed Care – PPO | Admitting: Neurology

## 2023-04-09 IMAGING — CT CT HEAD W/O CM
3 series · 15 of 47 positions shown, 18 images · non-contrast
Comparison: None.

CLINICAL DATA: Fall yesterday with facial pain, initial encounter

EXAM:
CT HEAD WITHOUT CONTRAST
TECHNIQUE: Contiguous axial images were obtained from the base of the skull
through the vertex without intravenous contrast.

[Series 2: head wo · axial · 0.45mm/px · z∈[+508,+652]mm · 9 of 35 slices shown, 12 images]
[im 3/35  brain]
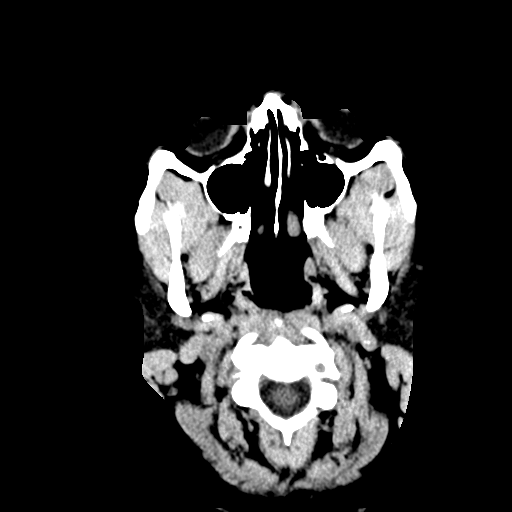
[im 3/35  bone]
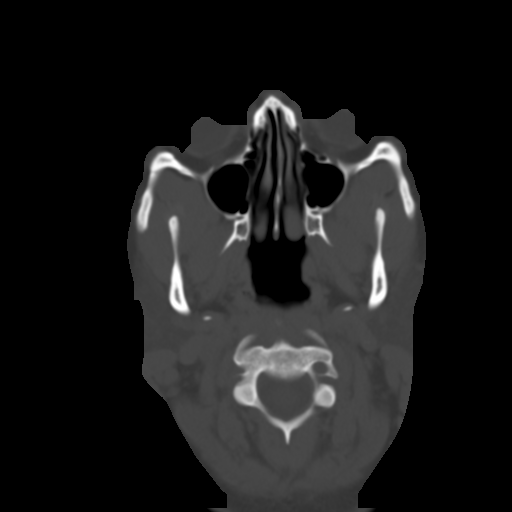
[im 6/35  brain]
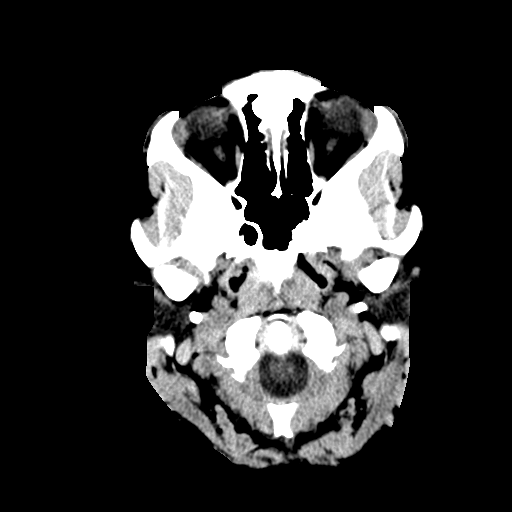
[im 10/35  brain]
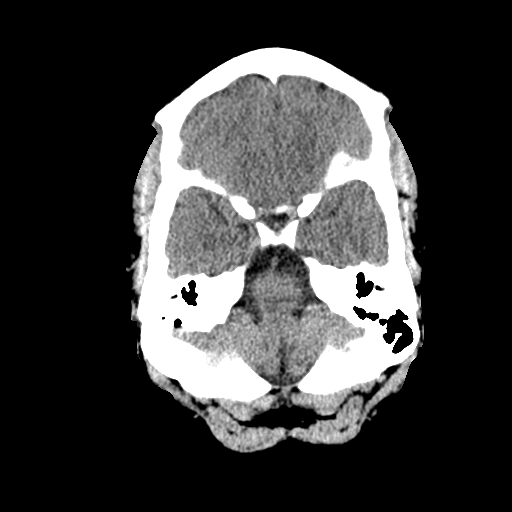
[im 13/35  brain]
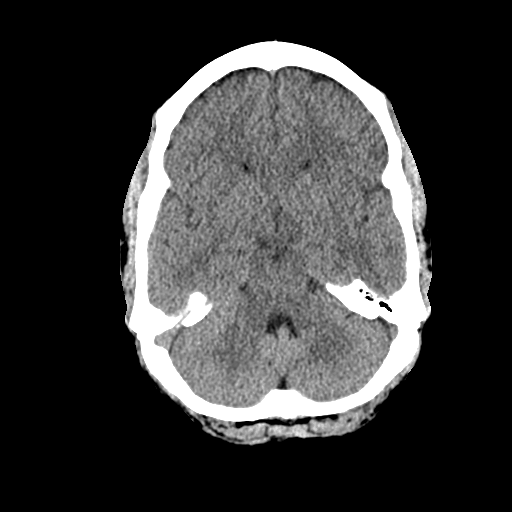
[im 18/35  brain]
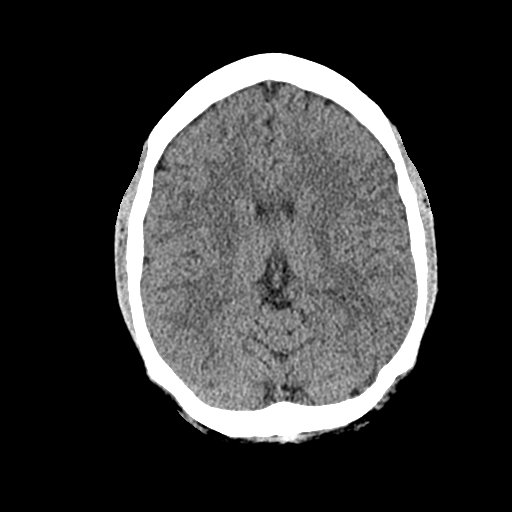
[im 18/35  bone]
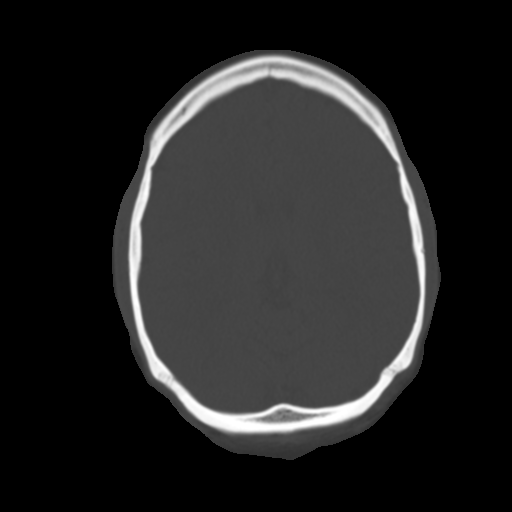
[im 22/35  brain]
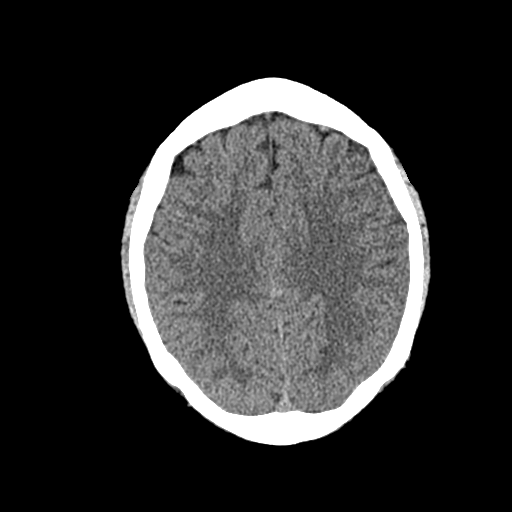
[im 25/35  brain]
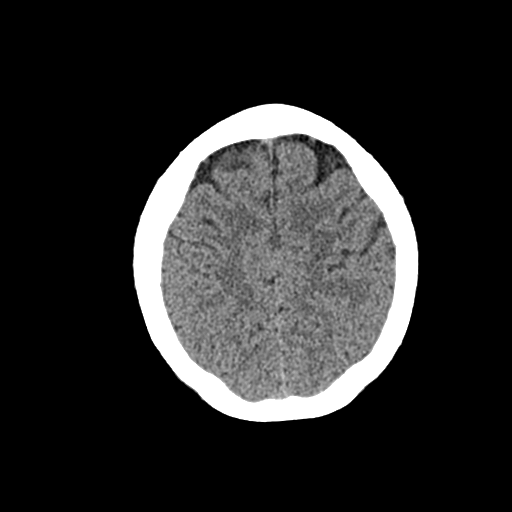
[im 29/35  brain]
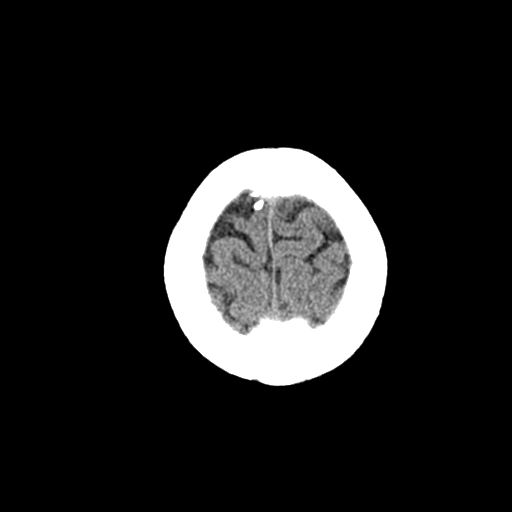
[im 32/35  brain]
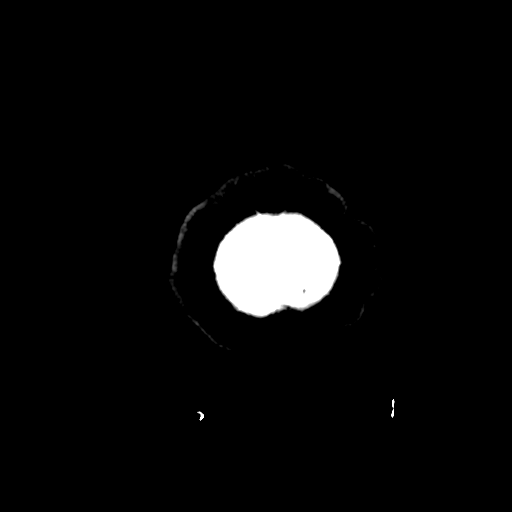
[im 32/35  bone]
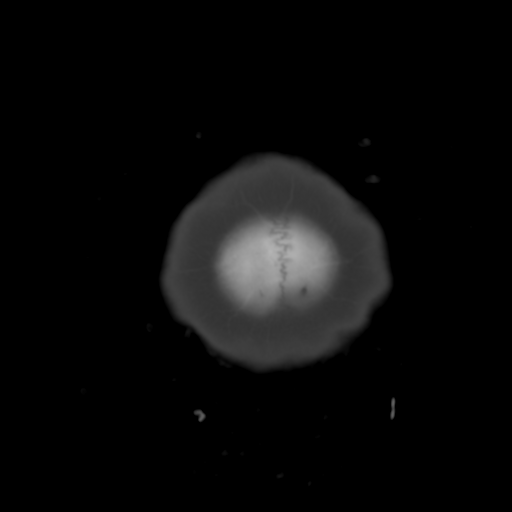

[Series 4: cor soft · coronal · 0.36mm/px · 3 of 72 slices shown]
[im 24/72  brain]
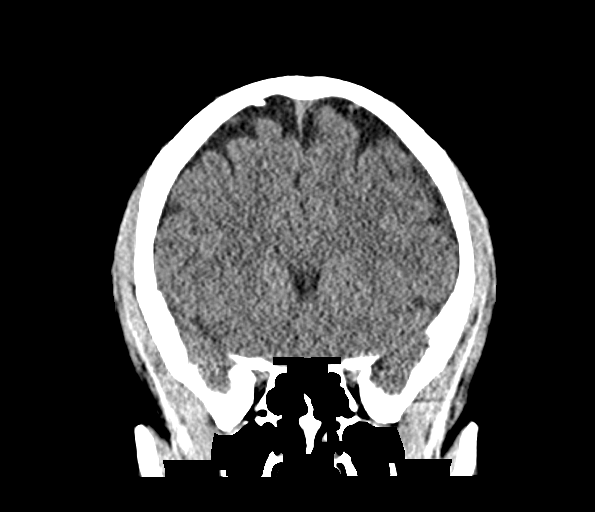
[im 32/72  brain]
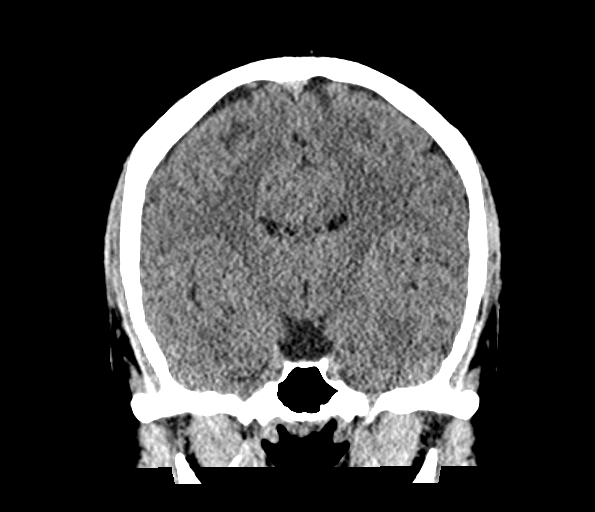
[im 40/72  brain]
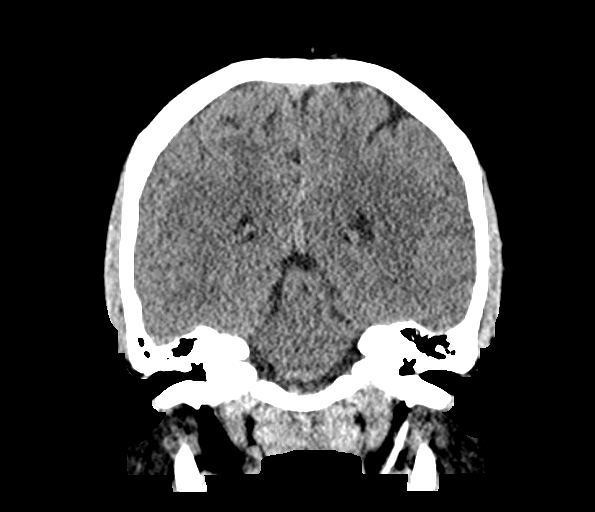

[Series 5: sag soft · sagittal · 0.36mm/px · 3 of 65 slices shown]
[im 22/65  brain]
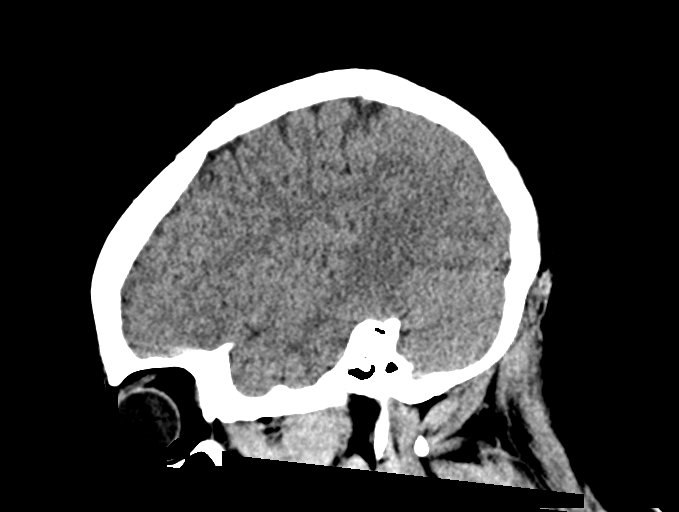
[im 33/65  brain]
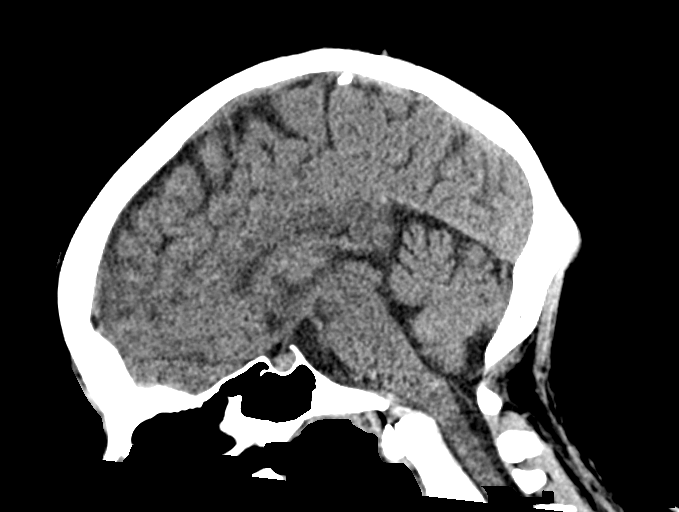
[im 43/65  brain]
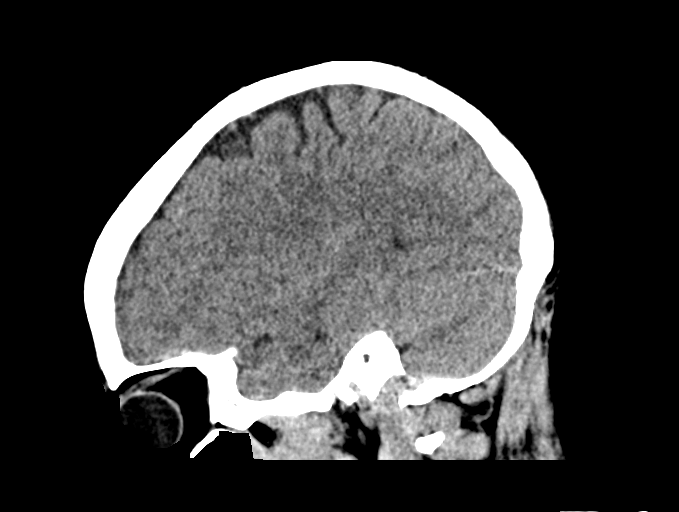

[15 of 47 positions shown; findings below may reference images not displayed]

FINDINGS: Brain: No evidence of acute infarction, hemorrhage, hydrocephalus,
extra-axial collection or mass lesion/mass effect.

Vascular: No hyperdense vessel or unexpected calcification.

Skull: Normal. Negative for fracture or focal lesion.

Sinuses/Orbits: No acute finding.

Other: None.
IMPRESSION: No acute intracranial abnormality noted.

## 2023-05-14 ENCOUNTER — Encounter: Payer: Self-pay | Admitting: Emergency Medicine

## 2023-05-14 ENCOUNTER — Other Ambulatory Visit: Payer: Self-pay

## 2023-05-14 ENCOUNTER — Emergency Department
Admission: EM | Admit: 2023-05-14 | Discharge: 2023-05-15 | Disposition: A | Discharge status: Home / Self Care | Attending: Emergency Medicine | Admitting: Emergency Medicine

## 2023-05-14 DIAGNOSIS — N941 Unspecified dyspareunia: Secondary | ICD-10-CM | POA: Diagnosis not present

## 2023-05-14 DIAGNOSIS — R102 Pelvic and perineal pain: Secondary | ICD-10-CM | POA: Diagnosis present

## 2023-05-14 LAB — URINALYSIS, ROUTINE W REFLEX MICROSCOPIC
Bilirubin Urine: NEGATIVE
Glucose, UA: NEGATIVE mg/dL
Hgb urine dipstick: NEGATIVE
Ketones, ur: NEGATIVE mg/dL
Leukocytes,Ua: NEGATIVE
Nitrite: NEGATIVE
Protein, ur: NEGATIVE mg/dL
Specific Gravity, Urine: 1.017 (ref 1.005–1.030)
pH: 8 (ref 5.0–8.0)

## 2023-05-14 LAB — PREGNANCY, URINE: Preg Test, Ur: NEGATIVE

## 2023-05-14 NOTE — ED Triage Notes (Signed)
 Pt c/o suprapubic pain x 2-3 days, pain with intercourse, denies fevers/urinary symptoms/discharge

## 2023-05-14 NOTE — ED Provider Notes (Signed)
 [facility] Provider Note    Event Date/Time   First MD Initiated Contact with Patient 05/14/23 2310     (approximate)   History   Abdominal Pain   HPI Mindy Powers is a 23 y.o. female who presents for evaluation of 2 to 3 days of pelvic pain as well as pain with intercourse.  It is relatively persistent aching pain, sometimes sharp.  No dysuria.  No recent fever.  She has been told that she has ovarian cysts in the past but the pain is more in the suprapubic region rather than localized to either side.  No significant pain with movement.  No lightheadedness or dizziness.  She is sexually active with 1 partner and does not use condoms.  She does not use birth control.     Physical Exam   Triage Vital Signs: ED Triage Vitals  Encounter Vitals Group     BP 05/14/23 2105 119/77     Systolic BP Percentile --      Diastolic BP Percentile --      Pulse Rate 05/14/23 2105 93     Resp 05/14/23 2105 16     Temp 05/14/23 2105 99 F (37.2 C)     Temp Source 05/14/23 2105 Oral     SpO2 05/14/23 2105 100 %     Weight 05/14/23 2106 97.5 kg (215 lb)     Height 05/14/23 2106 1.727 m (5\' 8" )     Head Circumference --      Peak Flow --      Pain Score 05/14/23 2106 8     Pain Loc --      Pain Education --      Exclude from Growth Chart --     Most recent vital signs: Vitals:   05/14/23 2105  BP: 119/77  Pulse: 93  Resp: 16  Temp: 99 F (37.2 C)  SpO2: 100%    General: Awake, no distress.  CV:  Good peripheral perfusion.  Resp:  Normal effort. Speaking easily and comfortably, no accessory muscle usage nor intercostal retractions.   Abd:  No distention.  Soft with no tenderness to palpation throughout the abdomen including the suprapubic region.  No guarding or rebound. GU:  Normal external genital exam.  Cervix appears healthy with no evidence of cervicitis, no lesions, no bleeding.  Bimanual exam was nontender and reassuring.  ED chaperone present  throughout exam.   ED Results / Procedures / Treatments   Labs (all labs ordered are listed, but only abnormal results are displayed) Labs Reviewed  WET PREP, GENITAL - Abnormal; Notable for the following components:      Result Value   WBC, Wet Prep HPF POC <10 (*)    All other components within normal limits  URINALYSIS, ROUTINE W REFLEX MICROSCOPIC - Abnormal; Notable for the following components:   Color, Urine YELLOW (*)    APPearance CLEAR (*)    All other components within normal limits  CHLAMYDIA/NGC RT PCR ([facility] ONLY)            PREGNANCY, URINE     PROCEDURES:  Critical Care performed: No  Procedures    IMPRESSION / MDM / ASSESSMENT AND PLAN / ED COURSE  I reviewed the triage vital signs and the nursing notes.                              Differential diagnosis includes, but is  not limited to, STD/PID/TOA, UTI, ovarian cyst, ovarian torsion.  Patient's presentation is most consistent with acute presentation with potential threat to life or bodily function.  Labs/studies ordered: Urinalysis, urine pregnancy, wet prep, GC/chlamydia  Interventions/Medications given:  Medications - No data to display  (Note:  hospital course my include additional interventions and/or labs/studies not listed above.)   Benign abdominal exam, stable vitals.  Normal urinalysis and negative urine pregnancy test.  Discussed self collection of swabs with patient versus pelvic exam and she would like to proceed with the full pelvic exam so that is pending.  I talked with her and her mom who is at bedside with her permission about ultrasound.  I think ultrasound is a very limited utility for the patient unless I identify something on pelvic exam that I feel requires additional evaluation; this is based on a completely benign abdominal exam and my very low suspicion for a ruptured ovarian cyst, torsion, TOA, etc.  They are comfortable with the current plan for evaluation.    Clinical  Course as of 05/15/23 0127  Mon May 15, 2023  0036 Reassuring and unremarkable pelvic exam.  Patient wants to wait for the results of the wet prep, will consider following up on MyChart to find the results of GC/chlamydia test, but I will reassess after the wet prep is back. [CF]  0124 Wet prep, genital(!) Patient's wet prep is normal.  I talked with her about the results and asked.  She would like to stay for gonorrhea and chlamydia results but she would prefer to leave and follow-up on MyChart.  She knows that if she has a positive or reactive result she will need to follow-up at the health department or with another provider to get treatment.  The patient's medical screening exam is reassuring with no indication of an emergent medical condition requiring hospitalization or additional evaluation at this point.  The patient is safe and appropriate for discharge and outpatient follow up. [CF]    Clinical Course User Index [CF] Lynnda Sas, MD     FINAL CLINICAL IMPRESSION(S) / ED DIAGNOSES   Final diagnoses:  Pelvic pain  Dyspareunia in female     Rx / DC Orders   ED Discharge Orders     None        Note:  This document was prepared using Dragon voice recognition software and may include unintentional dictation errors.   Lynnda Sas, MD 05/15/23 [phone]

## 2023-05-15 LAB — CHLAMYDIA/NGC RT PCR (ARMC ONLY)
Chlamydia Tr: NOT DETECTED
N gonorrhoeae: NOT DETECTED

## 2023-05-15 LAB — WET PREP, GENITAL
Clue Cells Wet Prep HPF POC: NONE SEEN
Sperm: NONE SEEN
Trich, Wet Prep: NONE SEEN
WBC, Wet Prep HPF POC: <10 — AB (ref <10)
Yeast Wet Prep HPF POC: NONE SEEN

## 2023-05-15 NOTE — ED Notes (Signed)
 Written and verbal discharge instructions reviewed with patient, understanding verbalized, denied questions. Discharged from unit ambulatory in good condition alone.

## 2023-05-15 NOTE — Discharge Instructions (Signed)
 Your workup in the Emergency Department today was reassuring.  We did not find any specific abnormalities.  We recommend you drink plenty of fluids, take your regular medications and/or any new ones prescribed today, and follow up with the doctor(s) listed in these documents as recommended.  Remember to check MyChart for the results of your gonorrhea and Chlamydia tests.  If you have the infection, it will show up as "positive" or "reactive".  If that is the case, you will need to follow-up at the health department or with one of your providers to get treatment.  Return to the Emergency Department if you develop new or worsening symptoms that concern you.
